# Patient Record
Sex: Female | Born: 1965 | Race: White | Hispanic: No | Marital: Married | Smoking: Former smoker
Health system: Southern US, Community
[De-identification: ages and names within clinical notes are randomized; demographics above are authoritative.]

---

## 2008-02-10 ENCOUNTER — Encounter: Admission: RE | Admit: 2008-02-10 | Discharge: 2008-02-10 | Payer: Self-pay | Admitting: Unknown Physician Specialty

## 2009-02-08 ENCOUNTER — Encounter: Admission: RE | Admit: 2009-02-08 | Discharge: 2009-02-08 | Payer: Self-pay | Admitting: Unknown Physician Specialty

## 2011-09-07 ENCOUNTER — Other Ambulatory Visit: Payer: Self-pay | Admitting: Unknown Physician Specialty

## 2011-09-07 ENCOUNTER — Ambulatory Visit
Admission: RE | Admit: 2011-09-07 | Discharge: 2011-09-07 | Disposition: A | Discharge status: Home / Self Care | Payer: Self-pay | Source: Ambulatory Visit | Attending: Unknown Physician Specialty | Admitting: Unknown Physician Specialty

## 2011-09-07 DIAGNOSIS — M542 Cervicalgia: Secondary | ICD-10-CM

## 2011-09-07 DIAGNOSIS — M546 Pain in thoracic spine: Secondary | ICD-10-CM

## 2015-12-23 ENCOUNTER — Encounter: Payer: Self-pay | Admitting: Sports Medicine

## 2015-12-23 ENCOUNTER — Ambulatory Visit (INDEPENDENT_AMBULATORY_CARE_PROVIDER_SITE_OTHER): Payer: BLUE CROSS/BLUE SHIELD | Admitting: Sports Medicine

## 2015-12-23 VITALS — BP 110/63 | HR 64 | Wt 130.0 lb

## 2015-12-23 DIAGNOSIS — M5136 Other intervertebral disc degeneration, lumbar region: Secondary | ICD-10-CM

## 2015-12-23 DIAGNOSIS — M51369 Other intervertebral disc degeneration, lumbar region without mention of lumbar back pain or lower extremity pain: Secondary | ICD-10-CM | POA: Insufficient documentation

## 2015-12-23 NOTE — Assessment & Plan Note (Signed)
Multilevel lumbar degenerative disc disease with occasional right-sided radiculopathy. Pain is predominantly axial and discogenic. Continue meloxicam which works wonders, formal physical therapy, return to see me in one month, MRI for interventional planning if no better. Also advised traction with an inversion table, her sister has one.

## 2015-12-23 NOTE — Progress Notes (Signed)
   Subjective:    I'm seeing this patient as a consultation for:  Dr. Harl Bowie  CC: Low back pain  HPI: For 7 years this pleasant 50 year old female has had predominantly axial low back pain with occasional radiation down the right leg, worse with sitting, flexion, Valsalva. She was seen by an orthopedist and prescribed meloxicam and physical therapy which was tremendously effective sometime ago, more recently she had a recurrence of pain in her back, and was appropriately prescribed steroids and meloxicam, and is feeling significantly better. No bowel or bladder dysfunction, saddle numbness, no constitutional symptoms.  Past medical history, Surgical history, Family history not pertinant except as noted below, Social history, Allergies, and medications have been entered into the medical record, reviewed, and no changes needed.   Review of Systems: No headache, visual changes, nausea, vomiting, diarrhea, constipation, dizziness, abdominal pain, skin rash, fevers, chills, night sweats, weight loss, swollen lymph nodes, body aches, joint swelling, muscle aches, chest pain, shortness of breath, mood changes, visual or auditory hallucinations.   Objective:   General: Well Developed, well nourished, and in no acute distress.  Neuro/Psych: Alert and oriented x3, extra-ocular muscles intact, able to move all 4 extremities, sensation grossly intact. Skin: Warm and dry, no rashes noted.  Respiratory: Not using accessory muscles, speaking in full sentences, trachea midline.  Cardiovascular: Pulses palpable, no extremity edema. Abdomen: Does not appear distended. Back Exam:  Inspection: Unremarkable  Motion: Flexion 45 deg, Extension 45 deg, Side Bending to 45 deg bilaterally,  Rotation to 45 deg bilaterally  SLR laying: Negative  XSLR laying: Negative  Palpable tenderness: None. FABER: negative. Sensory change: Gross sensation intact to all lumbar and sacral dermatomes.  Reflexes: 2+ at both  patellar tendons, 2+ at achilles tendons, Babinski's downgoing.  Strength at foot  Plantar-flexion: 5/5 Dorsi-flexion: 5/5 Eversion: 5/5 Inversion: 5/5  Leg strength  Quad: 5/5 Hamstring: 5/5 Hip flexor: 5/5 Hip abductors: 5/5  Gait unremarkable.  X-rays reviewed and show multilevel diffuse degenerative disc disease  Impression and Recommendations:   This case required medical decision making of moderate complexity.

## 2016-01-03 ENCOUNTER — Encounter: Payer: Self-pay | Admitting: Rehabilitative and Restorative Service Providers"

## 2016-01-03 ENCOUNTER — Ambulatory Visit (INDEPENDENT_AMBULATORY_CARE_PROVIDER_SITE_OTHER): Payer: BLUE CROSS/BLUE SHIELD | Admitting: Rehabilitative and Restorative Service Providers"

## 2016-01-03 DIAGNOSIS — M623 Immobility syndrome (paraplegic): Secondary | ICD-10-CM | POA: Diagnosis not present

## 2016-01-03 DIAGNOSIS — M5136 Other intervertebral disc degeneration, lumbar region: Secondary | ICD-10-CM

## 2016-01-03 DIAGNOSIS — R531 Weakness: Secondary | ICD-10-CM

## 2016-01-03 DIAGNOSIS — R6889 Other general symptoms and signs: Secondary | ICD-10-CM

## 2016-01-03 DIAGNOSIS — M5441 Lumbago with sciatica, right side: Secondary | ICD-10-CM | POA: Diagnosis not present

## 2016-01-03 DIAGNOSIS — Z7409 Other reduced mobility: Secondary | ICD-10-CM

## 2016-01-03 DIAGNOSIS — M2560 Stiffness of unspecified joint, not elsewhere classified: Secondary | ICD-10-CM

## 2016-01-03 NOTE — Patient Instructions (Signed)
Abdominal Bracing With Pelvic Floor (Hook-Lying)    With neutral spine, tighten pelvic floor and abdominals sucking belly button to back bone; tighten muscles in back at waist  Hold 10 sec Repeat _10_ times. Do __several _ times a day. Progress to do this in sitting; standing; walking and with functional activities.  Trunk: Prone Extension (Press-Ups)    Lie on stomach on firm, flat surface. Relax bottom and legs. Raise chest in air with elbows straight. Keep hips flat on surface, sag stomach. Hold _2-3___ seconds. Repeat _10___ times. Do _2-3___ sessions per day. CAUTION: Movement should be gentle and slow.   Trunk Extension    Standing, place back of open hands on low back. Straighten spine then arch the back and move shoulders back. Repeat __1-2_ times per session. Do _several _ sessions per day.  HIP: Hamstrings - Supine    Place strap around foot. Raise leg up, keep knee straight. Hold _30__ seconds. _3_ reps per set, _2-3__ per day   Outer Hip Stretch: Reclined IT Band Stretch (Strap)    Strap around opposite foot, pull across only as far as possible with shoulders on mat. Hold for __30 sec. Repeat __3__ times each leg.   Sleeping on Back  Place pillow under knees. A pillow with cervical support and a roll around waist are also helpful. Copyright  VHI. All rights reserved.  Sleeping on Side Place pillow between knees. Use cervical support under neck and a roll around waist as needed. Copyright  VHI. All rights reserved.   Sleeping on Stomach   If this is the only desirable sleeping position, place pillow under lower legs, and under stomach or chest as needed.  Posture - Sitting   Sit upright, head facing forward. Try using a roll to support lower back. Keep shoulders relaxed, and avoid rounded back. Keep hips level with knees. Avoid crossing legs for long periods. Stand to Sit / Sit to Stand   To sit: Bend knees to lower self onto front edge of chair,  then scoot back on seat. To stand: Reverse sequence by placing one foot forward, and scoot to front of seat. Use rocking motion to stand up.   Work Height and Reach  Ideal work height is no more than 2 to 4 inches below elbow level when standing, and at elbow level when sitting. Reaching should be limited to arm's length, with elbows slightly bent.  Bending  Bend at hips and knees, not back. Keep feet shoulder-width apart.    Posture - Standing   Good posture is important. Avoid slouching and forward head thrust. Maintain curve in low back and align ears over shoul- ders, hips over ankles.  Alternating Positions   Alternate tasks and change positions frequently to reduce fatigue and muscle tension. Take rest breaks. Computer Work   Position work to Art gallery managerface forward. Use proper work and seat height. Keep shoulders back and down, wrists straight, and elbows at right angles. Use chair that provides full back support. Add footrest and lumbar roll as needed.  Getting Into / Out of Car  Lower self onto seat, scoot back, then bring in one leg at a time. Reverse sequence to get out.  Dressing  Lie on back to pull socks or slacks over feet, or sit and bend leg while keeping back straight.    Housework - Sink  Place one foot on ledge of cabinet under sink when standing at sink for prolonged periods.   Pushing / Pulling  Pushing  is preferable to pulling. Keep back in proper alignment, and use leg muscles to do the work.  Deep Squat   Squat and lift with both arms held against upper trunk. Tighten stomach muscles without holding breath. Use smooth movements to avoid jerking.  Avoid Twisting   Avoid twisting or bending back. Pivot around using foot movements, and bend at knees if needed when reaching for articles.  Carrying Luggage   Distribute weight evenly on both sides. Use a cart whenever possible. Do not twist trunk. Move body as a unit.   Lifting Principles .Maintain  proper posture and head alignment. .Slide object as close as possible before lifting. .Move obstacles out of the way. .Test before lifting; ask for help if too heavy. .Tighten stomach muscles without holding breath. .Use smooth movements; do not jerk. .Use legs to do the work, and pivot with feet. .Distribute the work load symmetrically and close to the center of trunk. .Push instead of pull whenever possible.   Ask For Help   Ask for help and delegate to others when possible. Coordinate your movements when lifting together, and maintain the low back curve.  Log Roll   Lying on back, bend left knee and place left arm across chest. Roll all in one movement to the right. Reverse to roll to the left. Always move as one unit. Housework - Sweeping  Use long-handled equipment to avoid stooping.   Housework - Wiping  Position yourself as close as possible to reach work surface. Avoid straining your back.  Laundry - Unloading Wash   To unload small items at bottom of washer, lift leg opposite to arm being used to reach.  Gardening - Raking  Move close to area to be raked. Use arm movements to do the work. Keep back straight and avoid twisting.     Cart  When reaching into cart with one arm, lift opposite leg to keep back straight.   Getting Into / Out of Bed  Lower self to lie down on one side by raising legs and lowering head at the same time. Use arms to assist moving without twisting. Bend both knees to roll onto back if desired. To sit up, start from lying on side, and use same move-ments in reverse. Housework - Vacuuming  Hold the vacuum with arm held at side. Step back and forth to move it, keeping head up. Avoid twisting.   Laundry - Armed forces training and education officer so that bending and twisting can be avoided.   Laundry - Unloading Dryer  Squat down to reach into clothes dryer or use a reacher.  Gardening - Weeding / Psychiatric nurse or Kneel. Knee pads  may be helpful.

## 2016-01-03 NOTE — Therapy (Signed)
[facility] [place] [address] [place] [place] [address] [place], [place], [address] Phone: [phone]   Fax:  [phone]  Physical Therapy Evaluation  Patient Details  Name: Debra Clayton MRN: [id number] Date of Birth: [birth year] Referring Provider: Dr. Benjamin Stain   Encounter Date: 01/03/2016      PT End of Session - 01/03/16 0739    Visit Number 1   Number of Visits 12   Date for PT Re-Evaluation 02/14/16   PT Start Time 0740   PT Stop Time 0838   PT Time Calculation (min) 58 min   Activity Tolerance Patient tolerated treatment well      History reviewed. No pertinent past medical history.  History reviewed. No pertinent past surgical history.  There were no vitals filed for this visit.  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 She has had a flare up of LBP over the last 1-2 months with sympotms progressively worsened over the past  few weeks. She was treated with muscle relaxants and steroid dose pac. She has had a great response to meds and is feeling much better.    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 5-10 min    How long can you stand comfortably? 5-10 min    How long can you walk comfortably? no limit   Diagnostic tests xrays    Patient Stated Goals prevent recurrent LBP    Currently in Pain? No/denies   Pain Score 0-No pain   Pain Location Back   Pain Orientation Lower;Mid   Pain Radiating Towards down  posterior thigh to knee intermittly - none in the past week    Pain Onset More than a month ago   Pain Frequency Intermittent   Aggravating Factors  prolonged sitting or standing; household chores   Pain Relieving Factors meds; TENS unit; heat             [facility] PT Assessment - 01/03/16 0001    Assessment   Medical Diagnosis Lumbar DDD   Referring Provider Dr. Benjamin Stain    Onset Date/Surgical Date 11/24/15   Hand Dominance Right   Next MD Visit no return    Prior Therapy for LBP ~7 yrs ago    Precautions   Precautions None   Balance Screen   Has the patient fallen in the past 6 months No   Has the patient had a decrease in activity level because of a fear of falling?  No   Is the patient reluctant to leave their home because of a fear of falling?  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    Observation/Other Assessments   Focus on Therapeutic Outcomes (FOTO)  37% ;limitations    Sensation   Additional Comments intermittent numbness in posterior Rt thigh which she notices randomly    Posture/Postural Control   Posture Comments slight head forward posture - good alignment   AROM   Right/Left Hip --  WFL's bilat    Lumbar Flexion 85%   Lumbar Extension 30%   Lumbar - Right Side Bend 75%   Lumbar - Left Side Bend 75%   Lumbar - Right Rotation 75%   Lumbar - Left Rotation 75%   Strength   Overall Strength Comments 5/5 Lt LE/Rt LE except hip ext, abd 5-/5    Flexibility   Hamstrings Rt 70 deg; lt 75 deg    Quadriceps heel to buttock Rt 5 in; Lt 4 in   ITB tight Rt   Piriformis  tight Rt   Palpation   Spinal mobility tight PA mobs lower lumbar no pain    Palpation comment tight Rt lumbar paraspinals;  glut med; piriformis    Special Tests    Special Tests --  tight figure 4 Rt no radicular pain                    [facility] Adult PT Treatment/Exercise - 01/03/16 0001    Self-Care   Self-Care --  initiated spine care   Lumbar Exercises: Stretches   Passive Hamstring Stretch 3 reps;30 seconds   Press Ups --  2-3 sec hold 10 reps    ITB Stretch 3 reps;30 seconds   Lumbar Exercises: Supine   Ab Set --  3 part core 10 sec x 10    Moist Heat Therapy   Number Minutes Moist Heat 15 Minutes   Moist Heat Location Lumbar Spine   Electrical Stimulation   Electrical Stimulation Location bilat L4/5; Rt hip    Electrical Stimulation Action IFC   Electrical Stimulation Parameters to tolerance   Electrical Stimulation Goals Pain;Tone                PT Education - 01/03/16 0818    Education provided Yes   Education Details HEP; spine care    Person(s) Educated Patient   Methods Explanation;Demonstration;Tactile cues;Verbal cues;Handout   Comprehension Verbalized understanding;Returned demonstration;Verbal cues required;Tactile cues required             PT Long Term Goals - 01/03/16 0839    PT LONG TERM GOAL #1   Title Improve core stability allowing pt to perform normal functional activities without pain 02/14/16   Time 6   Period Weeks   Status New   PT LONG TERM GOAL #2   Title Improve tissue extensibility through Rt hip musculature including hamstrings; quads; IT band; piriformis - Rt =/> Lt 02/14/16   Time 6   Period Weeks   Status New   PT LONG TERM GOAL #3   Title Improve sitting and standing tolerance to 30-45 min without symptoms 02/14/16   Time 6   Period Weeks   Status New   PT LONG TERM GOAL #4   Title I in HEP 02/14/16    Time 6   Period Weeks   Status New   PT LONG TERM GOAL #5   Title Improve FOTO to </= 25%  limitation 02/14/16   Time 6   Period Weeks   Status New  Plan - 01/03/16 0847    Clinical Impression Statement Patient presents with recurrent LBP and Rt LE radicular symptoms consistent with lumbar DDD. She has limited trunk extension; tightness through Rt hip musculature; myofacial tightness to palpation in the lumbar and Rt hip musculature; and limited functional activity due to pain. She will benefit from  PT to address deficits identified.    Pt will benefit from skilled therapeutic intervention in order to improve on the following deficits Postural dysfunction;Improper body mechanics;Increased fascial restricitons;Decreased range of motion;Decreased mobility;Decreased strength;Pain;Decreased endurance;Decreased activity tolerance   Rehab Potential Good   PT Frequency 2x / week   PT Duration 6 weeks   PT Treatment/Interventions Patient/family education;ADLs/Self Care Home Management;Therapeutic exercise;Therapeutic activities;Manual techniques;Dry needling;Neuromuscular re-education;Cryotherapy;Electrical Stimulation;Iontophoresis 4mg /ml Dexamethasone;Moist Heat;Ultrasound   PT Next Visit Plan stretch piriformis/quads; progress with core stabilization; manual work to LB and Rt hip as needed; modalities as indicated; cont spinal education    PT Home Exercise Plan spine care; ergonomics; HEP    Consulted and Agree with Plan of Care Patient         Problem List Patient Active Problem List   Diagnosis Date Noted  . Lumbar degenerative disc disease 12/23/2015    Yaneliz Radebaugh Rober MinionP Jordayn Mink PT, MPH  01/03/2016, 8:54 AM  [facility] [address] [place], [place], [address] Phone: [phone]   Fax:  [phone]  Name: Debra Clayton MRN: [id number] Date of Birth: 02/05/1966

## 2016-01-05 ENCOUNTER — Ambulatory Visit (INDEPENDENT_AMBULATORY_CARE_PROVIDER_SITE_OTHER): Payer: BLUE CROSS/BLUE SHIELD | Admitting: Physical Therapy

## 2016-01-05 DIAGNOSIS — M2560 Stiffness of unspecified joint, not elsewhere classified: Secondary | ICD-10-CM

## 2016-01-05 DIAGNOSIS — Z7409 Other reduced mobility: Secondary | ICD-10-CM | POA: Diagnosis not present

## 2016-01-05 DIAGNOSIS — M5136 Other intervertebral disc degeneration, lumbar region: Secondary | ICD-10-CM

## 2016-01-05 DIAGNOSIS — M623 Immobility syndrome (paraplegic): Secondary | ICD-10-CM

## 2016-01-05 DIAGNOSIS — M5441 Lumbago with sciatica, right side: Secondary | ICD-10-CM

## 2016-01-05 DIAGNOSIS — R531 Weakness: Secondary | ICD-10-CM

## 2016-01-05 NOTE — Therapy (Signed)
[facility] [address] [place] [address] [place], [place], [address] Phone: [phone]   Fax:  [phone]  Physical Therapy Treatment  Patient Details  Name: Debra Clayton MRN: [id number] Date of Birth: 07/28/1966 Referring Provider: Dr. Briant Siteshekkekandem   Encounter Date: 01/05/2016      PT End of Session - 01/05/16 0805    Visit Number 2   Number of Visits 12   Date for PT Re-Evaluation 02/14/16   PT Start Time 0733   PT Stop Time 0820   PT Time Calculation (min) 47 min   Activity Tolerance Patient tolerated treatment well;No increased pain      No past medical history on file.  No past surgical history on file.  There were no vitals filed for this visit.  Visit Diagnosis:  DDD (degenerative disc disease), lumbar  Bilateral low back pain with right-sided sciatica  Stiffness due to immobility  Decreased strength, endurance, and mobility      Subjective Assessment - 01/05/16 0736    Subjective Pt reports she has been tender and sore since last visit.  Took Mobic last night due to pain (3-4/10).  Used TENS last night for pain relief.    Currently in Pain? No/denies            [facility] PT Assessment - 01/05/16 0001    Assessment   Medical Diagnosis Lumbar DDD   Referring Provider Dr. Briant Siteshekkekandem    Onset Date/Surgical Date 11/24/15   Hand Dominance Right   Next MD Visit PRN          [facility] - 01/05/16 0001    Lumbar Exercises: Stretches   Passive Hamstring Stretch 30 seconds;3 reps  sitting up    Piriformis Stretch 2 reps;30 seconds  sitting up    Piriformis Stretch Limitations repeated stretch in supine    Lumbar Exercises: Aerobic   Stationary Bike NuStep L5: 5 min    Lumbar Exercises: Supine   Ab Set 5 reps;5 seconds   Lumbar Exercises: Sidelying   Clam 15 reps  each side   Moist Heat Therapy   Number Minutes Moist Heat 15 Minutes   Moist Heat Location Hip;Lumbar Spine   Electrical Stimulation   Electrical Stimulation Location Rt glute/ deep rotators   Electrical Stimulation Action IFC   Electrical Stimulation Parameters to tolerance    Electrical Stimulation Goals Tone;Pain   Manual Therapy   Manual Therapy Soft tissue mobilization   Manual therapy comments pt in prone and sidelying    Soft tissue mobilization to Rt/Lt hip abd/deep rotators; TPR to Rt glute med/piriformis           PT Long Term Goals - 01/03/16 0839    PT LONG TERM GOAL #1   Title Improve core stability allowing pt to perform normal functional activities without pain 02/14/16   Time 6   Period Weeks   Status New   PT LONG TERM GOAL #2   Title Improve tissue extensibility through Rt hip musculature including hamstrings; quads; IT band; piriformis - Rt =/> Lt 02/14/16   Time 6   Period Weeks   Status New   PT LONG TERM GOAL #3   Title Improve sitting and standing tolerance to 30-45 min without symptoms 02/14/16   Time 6   Period Weeks   Status New   PT LONG TERM GOAL #4   Title I in HEP 02/14/16    Time 6   Period Weeks   Status New  PT LONG TERM GOAL #5   Title Improve FOTO to </= 25% limitation 02/14/16   Time 6   Period Weeks   Status New               Plan - 01/05/16 0806    Clinical Impression Statement Pt point tender in Rt glute with manual therapy; reduced pain and tightness with use of MHP and estim at end of session.  Pt tolerated new exercises without increase in symptoms or pain.    Pt will benefit from skilled therapeutic intervention in order to improve on the following deficits Postural dysfunction;Improper body mechanics;Increased fascial restricitons;Decreased range of motion;Decreased mobility;Decreased strength;Pain;Decreased endurance;Decreased activity tolerance   Rehab Potential Good   PT Frequency 2x / week   PT Duration 6 weeks   PT Treatment/Interventions Patient/family education;ADLs/Self Care Home Management;Therapeutic  exercise;Therapeutic activities;Manual techniques;Dry needling;Neuromuscular re-education;Cryotherapy;Electrical Stimulation;Iontophoresis /ml Dexamethasone;Moist Heat;Ultrasound   PT Next Visit Plan Add quad stretch to HEP.  Progress core stabilization exercises and add to HEP if tolerated   Consulted and Agree with Plan of Care Patient        Problem List Patient Active Problem List   Diagnosis Date Noted  . Lumbar degenerative disc disease 12/23/2015   Mayer Camel, PTA 01/05/2016 12:49 PM  [facility] [place] [address] [place], [place], [address] Phone: [phone]   Fax:  [phone]  Name: Debra Clayton MRN: [id number] Date of Birth: August 19, 1966

## 2016-01-10 ENCOUNTER — Ambulatory Visit (INDEPENDENT_AMBULATORY_CARE_PROVIDER_SITE_OTHER): Payer: BLUE CROSS/BLUE SHIELD | Admitting: Physical Therapy

## 2016-01-10 DIAGNOSIS — M623 Immobility syndrome (paraplegic): Secondary | ICD-10-CM

## 2016-01-10 DIAGNOSIS — M5136 Other intervertebral disc degeneration, lumbar region: Secondary | ICD-10-CM

## 2016-01-10 DIAGNOSIS — M5441 Lumbago with sciatica, right side: Secondary | ICD-10-CM | POA: Diagnosis not present

## 2016-01-10 DIAGNOSIS — Z7409 Other reduced mobility: Secondary | ICD-10-CM | POA: Diagnosis not present

## 2016-01-10 DIAGNOSIS — M2560 Stiffness of unspecified joint, not elsewhere classified: Secondary | ICD-10-CM

## 2016-01-10 DIAGNOSIS — R6889 Other general symptoms and signs: Secondary | ICD-10-CM

## 2016-01-10 DIAGNOSIS — R531 Weakness: Secondary | ICD-10-CM

## 2016-01-10 NOTE — Therapy (Addendum)
[facility] [place], [place], [address] Phone: [phone]   Fax:  [phone]  Physical Therapy Treatment  Patient Details  Name: Debra Clayton MRN: [id number] Date of Birth: June 21, 1966 Referring Provider: Dr. Helane Rima  Encounter Date: 01/10/2016      PT End of Session - 01/10/16 0736    Visit Number 3   Number of Visits 12   Date for PT Re-Evaluation 02/14/16   PT Start Time 0733   PT Stop Time 0819   PT Time Calculation (min) 46 min      No past medical history on file.  No past surgical history on file.  There were no vitals filed for this visit.  Visit Diagnosis:  DDD (degenerative disc disease), lumbar  Bilateral low back pain with right-sided sciatica  Stiffness due to immobility  Decreased strength, endurance, and mobility      Subjective Assessment - 01/10/16 0736    Subjective Pt reports she felt good after last visit.  Is using TENS at night when she has discomfort.  Has been conscious of how she is performing HEP to change her old habits.    Currently in Pain? No/denies            [facility] PT Assessment - 01/10/16 0001    Assessment   Medical Diagnosis Lumbar DDD   Referring Provider Dr. Helane Rima   Onset Date/Surgical Date 11/24/15   Hand Dominance Right   Next MD Visit PRN   Observation/Other Assessments   Focus on Therapeutic Outcomes (FOTO)  12% limited    AROM   Lumbar Flexion WNL   Lumbar Extension 80%   Lumbar - Right Side Bend WNL   Lumbar - Left Side Bend WNL   Lumbar - Right Rotation WNL   Lumbar - Left Rotation WNL           [facility] Adult PT Treatment/Exercise - 01/10/16 0001    Self-Care   Self-Care Other Self-Care Comments   Other Self-Care Comments  Pt educated on use of small ball for self massage to low back and hips. Pt returned demo.    Lumbar Exercises: Stretches   Passive Hamstring Stretch 30 seconds;3 reps  sitting up    Press Ups --   2-3 sec hold 10 reps    Piriformis Stretch 2 reps;30 seconds  sitting up    Lumbar Exercises: Supine   Ab Set 5 reps;5 seconds   Clam 10 reps  each leg with ab set   Bent Knee Raise 10 reps  each leg with ab set   Lumbar Exercises: Prone   Other Prone Lumbar Exercises pelvic presses x 5 sec x 10 reps; repeated with unilateral knee flex/ext x 10; with hip ext knee straight x 10 each leg    Moist Heat Therapy   Number Minutes Moist Heat 15 Minutes   Moist Heat Location Hip;Lumbar Spine   Electrical Stimulation   Electrical Stimulation Location Rt glute/ deep rotators   Electrical Stimulation Action IFC   Electrical Stimulation Parameters to tolerance    Electrical Stimulation Goals Tone;Pain                PT Education - 01/10/16 1705    Education provided Yes   Education Details HEP    Person(s) Educated Patient   Methods Explanation;Handout   Comprehension Verbalized understanding;Returned demonstration             PT Long Term Goals - 01/10/16 [phone]  PT LONG TERM GOAL #1   Title Improve core stability allowing pt to perform normal functional activities without pain 02/14/16   Time 6   Period Weeks   Status Achieved   PT LONG TERM GOAL #2   Title Improve tissue extensibility through Rt hip musculature including hamstrings; quads; IT band; piriformis - Rt =/> Lt 02/14/16   Time 6   Period Weeks   Status On-going   PT LONG TERM GOAL #3   Title Improve sitting and standing tolerance to 30-45 min without symptoms 02/14/16   Time 6   Period Weeks   Status Not Met  still has symptoms if sititng longer than 15 min    PT LONG TERM GOAL #4   Title I in HEP 02/14/16    Time 6   Period Weeks   Status On-going   PT LONG TERM GOAL #5   Title Improve FOTO to </= 25% limitation 02/14/16   Time 6   Period Weeks   Status Achieved               Plan - 01/10/16 1708    Clinical Impression Statement Pt has partially met her goals.  Pt tolerated all exercise  with minimal increase in pain. Pt has requested to hold therapy for 2 wks; if no problem she would like to d/c to HEP at that time.    Pt will benefit from skilled therapeutic intervention in order to improve on the following deficits Postural dysfunction;Improper body mechanics;Increased fascial restricitons;Decreased range of motion;Decreased mobility;Decreased strength;Pain;Decreased endurance;Decreased activity tolerance   Rehab Potential Good   PT Frequency 2x / week   PT Duration 6 weeks   PT Treatment/Interventions Patient/family education;ADLs/Self Care Home Management;Therapeutic exercise;Therapeutic activities;Manual techniques;Dry needling;Neuromuscular re-education;Cryotherapy;Electrical Stimulation;Iontophoresis 4mg/ml Dexamethasone;Moist Heat;Ultrasound   PT Next Visit Plan Spoke to supervising PT regarding pt's progress and her desire to hold therapy.  Will hold for 2 wks, then d/c to HEP if no further appts.     Consulted and Agree with Plan of Care Patient        Problem List Patient Active Problem List   Diagnosis Date Noted  . Lumbar degenerative disc disease 12/23/2015   Jennifer Carlson-Long, PTA 01/10/2016 5:10 PM  [facility] [address] [place], [place], [address] Phone: [phone]   Fax:  [phone]  Name: Debra Clayton MRN: [id number] Date of Birth: 02/27/1966    PHYSICAL THERAPY DISCHARGE SUMMARY  Visits from Start of Care: 3  Current functional level related to goals / functional outcomes: Progressing weill with rehab and use of TENS unit at home   Remaining deficits: Needs to continue with HEP    Education / Equipment: HEP  Plan: Patient agrees to discharge.  Patient goals were partially met. Patient is being discharged due to not returning since the last visit.  ?????    Celyn P. Holt PT, MPH 02/24/2016 8:31 AM   

## 2016-01-10 NOTE — Patient Instructions (Signed)
Pelvic Press     Place hands under belly between navel and pubic bone, palms up. Feel pressure on hands. Increase pressure on hands by pressing pelvis down. This is NOT a pelvic tilt. Hold __5_ seconds. Relax. Repeat _10__ times. Once a day.  KNEE: Flexion - Prone   Hold pelvic press. Bend knee, then return the foot down. Repeat on opposite leg. Do not raise hips. _10__ reps per set. When this is mastered, pull both heels up at same time, x 10 reps.  Once a day   Leg Lift: One-Leg   Press pelvis down. Keep knee straight; lengthen and lift one leg (from waist). Do not twist body. Keep other leg down. Hold _1__ seconds. Relax. Repeat 10 time. Repeat with other leg.    Abdominal Bracing With Pelvic Floor (Hook-Lying)   With neutral spine, tighten pelvic floor and abdominals. Hold 10 seconds. Repeat __10_ times. Do _1__ times a day.   Knee to Chest: Transverse Plane Stability   Bring one knee up, then return. Be sure pelvis does not roll side to side. Keep pelvis still. Lift knee __10_ times each leg. Restabilize pelvis. Repeat with other leg. Do _1-2__ sets, _1__ times per day.  Hip External Rotation With Pillow: Transverse Plane Stability   One knee bent, one leg straight, on pillow. Slowly roll bent knee out. Be sure pelvis does not rotate. Do _10__ times. Restabilize pelvis. Repeat with other leg. Do _1-2__ sets, _1__ times per day.  [facility] [address] [place], [place] [address]  [phone] (office) [phone] (fax)

## 2016-01-12 ENCOUNTER — Encounter: Payer: BLUE CROSS/BLUE SHIELD | Admitting: Physical Therapy

## 2016-02-14 ENCOUNTER — Ambulatory Visit (INDEPENDENT_AMBULATORY_CARE_PROVIDER_SITE_OTHER): Payer: BLUE CROSS/BLUE SHIELD

## 2016-02-14 ENCOUNTER — Other Ambulatory Visit: Payer: Self-pay | Admitting: Family Medicine

## 2016-02-14 DIAGNOSIS — R52 Pain, unspecified: Secondary | ICD-10-CM

## 2016-02-14 DIAGNOSIS — R059 Cough, unspecified: Secondary | ICD-10-CM

## 2016-02-14 DIAGNOSIS — R05 Cough: Secondary | ICD-10-CM

## 2016-02-14 DIAGNOSIS — R079 Chest pain, unspecified: Secondary | ICD-10-CM

## 2016-02-15 ENCOUNTER — Ambulatory Visit (INDEPENDENT_AMBULATORY_CARE_PROVIDER_SITE_OTHER): Payer: BLUE CROSS/BLUE SHIELD

## 2016-02-15 ENCOUNTER — Encounter: Payer: Self-pay | Admitting: Podiatry

## 2016-02-15 ENCOUNTER — Ambulatory Visit (INDEPENDENT_AMBULATORY_CARE_PROVIDER_SITE_OTHER): Payer: BLUE CROSS/BLUE SHIELD | Admitting: Podiatry

## 2016-02-15 VITALS — BP 104/54 | HR 57 | Resp 16

## 2016-02-15 DIAGNOSIS — M2040 Other hammer toe(s) (acquired), unspecified foot: Secondary | ICD-10-CM

## 2016-02-15 DIAGNOSIS — Q828 Other specified congenital malformations of skin: Secondary | ICD-10-CM | POA: Diagnosis not present

## 2016-02-15 NOTE — Progress Notes (Signed)
   Subjective:    Patient ID: Debra RidingJanie Clay, female    DOB: 09/07/1966, 50 y.o.   MRN: [id number]  HPI: She presents today with several month duration of small painful lesions to the lateral aspect of the fourth toes bilaterally. She states that it feels like small pebbles between my toes. Exquisitely painful she has utilized chemically treating pads to help alleviate her symptoms. She states that the right one has resolved pretty nicely however the left one is squeezing very painful. She relates that she exercises and runs a lot and wears tennis shoe (New Balance).    Review of Systems  Musculoskeletal: Positive for back pain and arthralgias.  All other systems reviewed and are negative.      Objective:   Physical Exam: I have reviewed her past medical history medications allergies surgery social history and review of symptoms. Pulses are strongly palpable. Neurologic sensorium is intact. Deep tendon reflexes are intact bilateral muscle strength is normal bilateral. Deep tendon reflexes are intact bilaterally muscle strength +5 over 5 dorsiflexion plantar flexors and inverters everters all intrinsic musculature is intact. She has mild adductor varus rotated hammertoe deformities fifth bilateral injected position to the lateral aspect of a hypertrophic fourth PIPJ. This is resulting in a reactive hyperkeratosis and porokeratotic lesion lateral aspect of the fourth toe fifth toe appears to be clear of any type of abnormality other than the aforementioned hammertoe deformity. Cutaneous evaluation does demonstrate a solitary porokeratotic lesion to the lateral aspect of the PIPJ fourth digit left foot one that has resolved on the right foot as well. No open lesions or wounds no bleeding is noted. No cellulitis.        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  Lamb: Discussed etiology pathology conservative versus  surgical therapies. We discussed wider shoe gear and appropriate exercise equipment. We also debrided the reactive hyperkeratosis leading her brace and to manage. There were no iatrogenic leading lesions and no bleeding. Follow up with her as needed.

## 2017-02-22 ENCOUNTER — Ambulatory Visit (INDEPENDENT_AMBULATORY_CARE_PROVIDER_SITE_OTHER): Payer: BLUE CROSS/BLUE SHIELD | Admitting: Podiatry

## 2017-02-22 ENCOUNTER — Encounter: Payer: Self-pay | Admitting: Podiatry

## 2017-02-22 ENCOUNTER — Ambulatory Visit (INDEPENDENT_AMBULATORY_CARE_PROVIDER_SITE_OTHER): Payer: BLUE CROSS/BLUE SHIELD

## 2017-02-22 DIAGNOSIS — M7752 Other enthesopathy of left foot: Secondary | ICD-10-CM | POA: Diagnosis not present

## 2017-02-22 DIAGNOSIS — M778 Other enthesopathies, not elsewhere classified: Secondary | ICD-10-CM

## 2017-02-22 DIAGNOSIS — M2040 Other hammer toe(s) (acquired), unspecified foot: Secondary | ICD-10-CM

## 2017-02-22 DIAGNOSIS — M779 Enthesopathy, unspecified: Secondary | ICD-10-CM

## 2017-02-22 NOTE — Progress Notes (Signed)
She presents today with chronic pain to the fifth digit of the left foot. She states that seems to be getting smaller sore as time goes on. She denies any trauma.  Objective: Vital signs are stable she is alert and oriented 3. She has some range of motion at the fifth PIPJ and DIPJ but very little. There is mild tenderness on palpation of these areas there is a reactive hyperkeratotic lesion overlying the dorsolateral PIPJ. 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  Assessment: Capsulitis digital deformity with exostosis.  Plan: Injected the toe around the PIPJ today with dexamethasone and local anesthetic discussed the possible need for hammertoe repair she understands this is amenable to a will follow up with us in the fall for such procedure.

## 2017-12-05 ENCOUNTER — Telehealth: Payer: Self-pay | Admitting: *Deleted

## 2017-12-05 NOTE — Telephone Encounter (Signed)
Pt states she is scheduled for 12/13/2017 with Dr. Al CorpusHyatt in hopes of getting a cortisone injection in her left 5th toe, last injected 02/22/2017. Pt states she would like to know if she will be able to get another injection at that time.

## 2017-12-05 NOTE — Telephone Encounter (Signed)
I told pt that in most cases she would be able to get the injection unless something else is going on, or she and Dr. Al CorpusHyatt choose a different treatment plan.

## 2017-12-13 ENCOUNTER — Ambulatory Visit: Payer: BLUE CROSS/BLUE SHIELD | Admitting: Podiatry

## 2017-12-13 ENCOUNTER — Encounter: Payer: Self-pay | Admitting: Podiatry

## 2017-12-13 DIAGNOSIS — M2042 Other hammer toe(s) (acquired), left foot: Secondary | ICD-10-CM

## 2017-12-13 DIAGNOSIS — M7752 Other enthesopathy of left foot: Secondary | ICD-10-CM

## 2017-12-13 DIAGNOSIS — M779 Enthesopathy, unspecified: Secondary | ICD-10-CM

## 2017-12-13 DIAGNOSIS — M778 Other enthesopathies, not elsewhere classified: Secondary | ICD-10-CM

## 2017-12-16 NOTE — Progress Notes (Signed)
She presents today for follow-up of her hammertoe fifth.  She states please give me a shot this toe is giving me a fit.  Objective: Vital signs are stable alert and oriented x3.  Pulses are palpable.  No change in neurovascular status no cutaneous changes.  She has pain on palpation of the PIPJ of the fifth digit of the left foot.  This is consistent with osteoarthritic changes.  Capsulitis.  Assessment: Capsulitis osteoarthritis fifth PIPJ left.  Plan: After sterile Betadine skin prep and verbal consent I injected 2 mg of dexamethasone and local anesthetic subcutaneously at the level of the PIPJ second left.  She tolerated procedure well without complications we did discuss the need for surgical intervention in the future she understands and is amenable to a follow-up with me as needed.

## 2019-09-11 ENCOUNTER — Encounter: Payer: Self-pay | Admitting: Emergency Medicine

## 2019-09-11 ENCOUNTER — Ambulatory Visit
Admission: EM | Admit: 2019-09-11 | Discharge: 2019-09-11 | Disposition: A | Discharge status: Home / Self Care | Payer: BC Managed Care – PPO | Attending: Nurse Practitioner | Admitting: Nurse Practitioner

## 2019-09-11 ENCOUNTER — Other Ambulatory Visit: Payer: Self-pay

## 2019-09-11 DIAGNOSIS — M545 Low back pain, unspecified: Secondary | ICD-10-CM

## 2019-09-11 LAB — POCT URINALYSIS DIP (MANUAL ENTRY)
Bilirubin, UA: NEGATIVE
Blood, UA: NEGATIVE
Glucose, UA: NEGATIVE mg/dL
Ketones, POC UA: NEGATIVE mg/dL
Leukocytes, UA: NEGATIVE
Nitrite, UA: NEGATIVE
Protein Ur, POC: NEGATIVE mg/dL
Spec Grav, UA: 1.02
Urobilinogen, UA: 0.2 U/dL
pH, UA: 6.5

## 2019-09-11 MED ORDER — OXYCODONE-ACETAMINOPHEN 5-325 MG PO TABS: 1.0000 | ORAL_TABLET | ORAL | 0 refills | Status: AC | PRN

## 2019-09-11 MED ORDER — DEXAMETHASONE SODIUM PHOSPHATE 10 MG/ML IJ SOLN
10.0000 mg | Freq: Once | INTRAMUSCULAR | Status: AC
  Administered 2019-09-11: 10 mg via INTRAMUSCULAR

## 2019-09-11 MED ORDER — KETOROLAC TROMETHAMINE 60 MG/2ML IM SOLN
60.0000 mg | Freq: Once | INTRAMUSCULAR | Status: AC
  Administered 2019-09-11: 11:00:00 60 mg via INTRAMUSCULAR

## 2019-09-11 NOTE — ED Triage Notes (Addendum)
Pt presents to [facility] for assessment of left lower back pain after she was bent over last night looking in her makeup mirror, and felt "something happen" and has been having worsening of her chronic back pain, and it now radiates to her left groin.  Pt took 2 leftover Vicodin from a surgery a few years ago before arrival today

## 2019-09-11 NOTE — ED Provider Notes (Signed)
[facility]    CSN: [id number] Arrival date & time: 09/11/19  1023      History   Chief Complaint Chief Complaint  Patient presents with  . Back Pain    HPI Debra Clayton is a 53 y.o. female.   Subjective:  Debra Clayton is a 53 y.o. female who presents for evaluation of low back pain. The patient has had recurrent self limited episodes of low back pain in the past, previous osteoarthritis of lumbar spine and a previous herniated disc. Symptoms have been present for 1 week and are gradually worsening.  Onset was related to / precipitated by no known injury. The pain is located in the across the lower back and radiates to the left lower leg. The pain is described as sharp, stabbing, stiffness and throbbing and occurs intermittently. She rates her pain as a 10 on a scale of 0-10. Symptoms are exacerbated by extension and flexion. Symptoms are improved by muscle relaxants and narcotic pain medications. She has also tried meloxicam which provided minimal symptom relief. She denies any leg weakness, tingling/burning sensations in the leg, urinary incontinence, bowel incontinence, groin/perineal numbness, dysuria or frequency associated with the back pain. No fevers, malaise, nausea or vomiting. The patient has no "red flag" history indicative of complicated back pain.  The following portions of the patient's history were reviewed and updated as appropriate: allergies, current medications, past family history, past medical history, past social history, past surgical history and problem list.           History reviewed. No pertinent past medical history.  Patient Active Problem List   Diagnosis Date Noted  . Lumbar degenerative disc disease 12/23/2015    History reviewed. No pertinent surgical history.  OB History   No obstetric history on file.      Home Medications    Prior to Admission medications   Medication Sig Start Date End Date Taking? Authorizing  Provider  Ascorbic Acid (VITAMIN C PO) Take by mouth.    [provider]  BIOTIN PO Take by mouth.    [provider]  doxycycline (VIBRA-TABS) 100 MG tablet doxycycline hyclate 100 mg tablet    [provider]  meloxicam (MOBIC) 15 MG tablet Take 15 mg by mouth daily. 11/29/15   [provider]  methocarbamol (ROBAXIN) 500 MG tablet Take 500 mg by mouth 3 (three) times daily. 12/10/15   [provider]  MULTIPLE VITAMIN PO Take by mouth.    [provider]  oxyCODONE-acetaminophen (PERCOCET/ROXICET) 5-325 MG tablet Take 1 tablet by mouth every 4 (four) hours as needed for up to 5 days for severe pain. 09/11/19 09/16/19  Enrique Sack, FNP  PRO[facility]IR HFA [phone] Base) MCG/ACT inhaler USE 2 PUFFS INHALED EVERY 4-6 HOURS AS NEEDED 12/15/15   [provider]    Family History Family History  Problem Relation Age of Onset  . Diabetes Mother   . Thyroid disease Mother   . Diabetes Father   . Hypertension Father   . Skin cancer Father   . Prostate cancer Father     Social History Social History   Tobacco Use  . Smoking status: Former Research scientist (life sciences)  . Smokeless tobacco: Never Used  Substance Use Topics  . Alcohol use: Not Currently    Alcohol/week: 0.0 standard drinks  . Drug use: Never     Allergies   Penicillins   Review of Systems Review of Systems  Constitutional: Negative for fever.  Gastrointestinal: Negative for  nausea and vomiting.  Genitourinary: Negative for dysuria and frequency.  Musculoskeletal: Positive for back pain.  All other systems reviewed and are negative.    Physical Exam Triage Vital Signs ED Triage Vitals  Enc Vitals Group     BP 09/11/19 1036 94/63     Pulse Rate 09/11/19 1036 (!) 55     Resp 09/11/19 1036 16     Temp 09/11/19 1036 98.5 F (36.9 C)     Temp Source 09/11/19 1036 Oral     SpO2 09/11/19 1036 96 %     Weight --      Height --      Head Circumference --      Peak Flow --       Pain Score 09/11/19 1033 8     Pain Loc --      Pain Edu? --      Excl. in GC? --    No data found.  Updated Vital Signs BP 94/63 (BP Location: Left Arm)   Pulse (!) 55   Temp 98.5 F (36.9 C) (Oral)   Resp 16   SpO2 96%   Visual Acuity Right Eye Distance:   Left Eye Distance:   Bilateral Distance:    Right Eye Near:   Left Eye Near:    Bilateral Near:     Physical Exam Vitals signs reviewed.  Constitutional:      General: She is not in acute distress.    Appearance: Normal appearance. She is not ill-appearing or toxic-appearing.  HENT:     Head: Normocephalic.     Right Ear: Tympanic membrane normal.     Left Ear: Tympanic membrane normal.     Nose: Nose normal.     Mouth/Throat:     Mouth: Mucous membranes are moist.  Eyes:     Extraocular Movements: Extraocular movements intact.     Conjunctiva/sclera: Conjunctivae normal.     Pupils: Pupils are equal, round, and reactive to light.  Neck:     Musculoskeletal: Normal range of motion.  Cardiovascular:     Rate and Rhythm: Normal rate and regular rhythm.  Pulmonary:     Effort: Pulmonary effort is normal.     Breath sounds: Normal breath sounds.  Musculoskeletal: Normal range of motion.     Lumbar back: She exhibits tenderness, pain and spasm. She exhibits no swelling, no edema and no deformity.     Left upper leg: Normal.     Left lower leg: Normal.     Left foot: Normal.  Lymphadenopathy:     Cervical: No cervical adenopathy.  Skin:    General: Skin is warm and dry.  Neurological:     General: No focal deficit present.     Mental Status: She is alert.      UC Treatments / Results  Labs (all labs ordered are listed, but only abnormal results are displayed) Labs Reviewed  POCT URINALYSIS DIP (MANUAL ENTRY)    EKG   Radiology No results found.  Procedures Procedures (including critical care time)  Medications Ordered in UC Medications  dexamethasone (DECADRON) injection 10 mg (10 mg  Intramuscular Given 09/11/19 1100)  ketorolac (TORADOL) injection 60 mg (60 mg Intramuscular Given 09/11/19 1100)    Initial Impression / Assessment and Plan / UC Course  I have reviewed the triage vital signs and the nursing notes.  Pertinent labs & imaging results that were available during my care of the patient were reviewed by me and considered in  my medical decision making (see chart for details).    53 y.o. female with a history of self limited episodes of low back pain in the past, previous osteoarthritis of lumbar spine and a previous herniated disc presents with a 1-week history of worsening lower back with radiation down the left lower leg. The patient has no "red flag" history indicative of complicated back pain. UA unremarkable. Patient was given decadron and Toradol in the clinic. Natural history and expected course discussed. Agricultural engineerducational material distributed. Proper lifting, bending technique discussed. Stretching exercises discussed. Short (2-4 day) period of relative rest recommended until acute symptoms improve. Heat to affected area as needed for local pain relief. Continue Mobic daily for at least 7 days. Continue Vicodin as needed. Continue muscle relaxants per medication orders. Follow-up with orthopedics.   Today's evaluation has revealed no signs of a dangerous process. Discussed diagnosis with patient and/or guardian. Patient and/or guardian aware of their diagnosis, possible red flag symptoms to watch out for and need for close follow up. Patient and/or guardian understands verbal and written discharge instructions. Patient and/or guardian comfortable with plan and disposition.  Patient and/or guardian has a clear mental status at this time, good insight into illness (after discussion and teaching) and has clear judgment to make decisions regarding their care  This care was provided during an unprecedented National Emergency due to the Novel Coronavirus (COVID-19) pandemic.  COVID-19 infections and transmission risks place heavy strains on healthcare resources.  As this pandemic evolves, our facility, providers, and staff strive to respond fluidly, to remain operational, and to provide care relative to available resources and information. Outcomes are unpredictable and treatments are without well-defined guidelines. Further, the impact of COVID-19 on all aspects of urgent care, including the impact to patients seeking care for reasons other than COVID-19, is unavoidable during this national emergency. At this time of the global pandemic, management of patients has significantly changed, even for non-COVID positive patients given high local and regional COVID volumes at this time requiring high healthcare system and resource utilization. The standard of care for management of both COVID suspected and non-COVID suspected patients continues to change rapidly at the local, regional, national, and global levels. This patient was worked up and treated to the best available but ever changing evidence and resources available at this current time.   Documentation was completed with the aid of voice recognition software. Transcription may contain typographical errors.  Final Clinical Impressions(s) / UC Diagnoses   Final diagnoses:  Lumbar pain     Discharge Instructions     1) Take your Mobic daily for the next 7 days. 2) Continue your muscle relaxants three times a day for the next 3 days then resume as needed  3) Take pain medicines as needed for SEVERE pain only 4) alternate between ice and heat to affected areas  5) no heavy lifting, bending, climbing or twisting for at least 5 days  6) follow-up with orthopedics     ED Prescriptions    Medication Sig Dispense Auth. Provider   oxyCODONE-acetaminophen (PERCOCET/ROXICET) 5-325 MG tablet Take 1 tablet by mouth every 4 (four) hours as needed for up to 5 days for severe pain. 15 tablet Lurline IdolMurrill, Colbin Jovel, FNP     I have  reviewed the PDMP during this encounter.   Lurline IdolMurrill, Alwaleed Obeso, [place] 09/11/19 1114

## 2019-09-11 NOTE — ED Notes (Signed)
Patient able to ambulate independently  

## 2019-09-11 NOTE — Discharge Instructions (Signed)
1) Take your Mobic daily for the next 7 days. 2) Continue your muscle relaxants three times a day for the next 3 days then resume as needed  3) Take pain medicines as needed for SEVERE pain only 4) alternate between ice and heat to affected areas  5) no heavy lifting, bending, climbing or twisting for at least 5 days  6) follow-up with orthopedics

## 2019-10-03 ENCOUNTER — Other Ambulatory Visit: Payer: Self-pay | Admitting: Orthopedic Surgery

## 2019-10-03 DIAGNOSIS — M259 Joint disorder, unspecified: Secondary | ICD-10-CM

## 2019-10-15 ENCOUNTER — Ambulatory Visit
Admission: RE | Admit: 2019-10-15 | Discharge: 2019-10-15 | Disposition: A | Discharge status: Home / Self Care | Payer: BC Managed Care – PPO | Source: Ambulatory Visit | Attending: Orthopedic Surgery | Admitting: Orthopedic Surgery

## 2019-10-15 ENCOUNTER — Other Ambulatory Visit: Payer: Self-pay

## 2019-10-15 DIAGNOSIS — M259 Joint disorder, unspecified: Secondary | ICD-10-CM

## 2019-10-22 ENCOUNTER — Other Ambulatory Visit: Payer: Self-pay

## 2019-10-22 ENCOUNTER — Ambulatory Visit: Payer: BLUE CROSS/BLUE SHIELD | Admitting: Podiatry

## 2019-10-22 ENCOUNTER — Ambulatory Visit (INDEPENDENT_AMBULATORY_CARE_PROVIDER_SITE_OTHER): Payer: BC Managed Care – PPO

## 2019-10-22 DIAGNOSIS — M7752 Other enthesopathy of left foot: Secondary | ICD-10-CM

## 2019-10-22 DIAGNOSIS — M2041 Other hammer toe(s) (acquired), right foot: Secondary | ICD-10-CM | POA: Diagnosis not present

## 2019-10-22 DIAGNOSIS — M2042 Other hammer toe(s) (acquired), left foot: Secondary | ICD-10-CM

## 2019-10-22 DIAGNOSIS — M7751 Other enthesopathy of right foot: Secondary | ICD-10-CM

## 2019-10-22 NOTE — Patient Instructions (Signed)
Pre-Operative Instructions  Congratulations, you have decided to take an important step towards improving your quality of life.  You can be assured that the doctors and staff at [facility] will be with you every step of the way.  Here are some important things you should know:  1. Plan to be at the surgery center/hospital at least 1 (one) hour prior to your scheduled time, unless otherwise directed by the surgical center/hospital staff.  You must have a responsible adult accompany you, remain during the surgery and drive you home.  Make sure you have directions to the surgical center/hospital to ensure you arrive on time. 2. If you are having surgery at [facility] or [facility], you will need a copy of your medical history and physical form from your family physician within one month prior to the date of surgery. We will give you a form for your primary physician to complete.  3. We make every effort to accommodate the date you request for surgery.  However, there are times where surgery dates or times have to be moved.  We will contact you as soon as possible if a change in schedule is required.   4. No aspirin/ibuprofen for one week before surgery.  If you are on aspirin, any non-steroidal anti-inflammatory medications (Mobic, Aleve, Ibuprofen) should not be taken seven (7) days prior to your surgery.  You make take Tylenol for pain prior to surgery.  5. Medications - If you are taking daily heart and blood pressure medications, seizure, reflux, allergy, asthma, anxiety, pain or diabetes medications, make sure you notify the surgery center/hospital before the day of surgery so they can tell you which medications you should take or avoid the day of surgery. 6. No food or drink after midnight the night before surgery unless directed otherwise by surgical center/hospital staff. 7. No alcoholic beverages 24-hours prior to surgery.  No smoking 24-hours prior or 24-hours after  surgery. 8. Wear loose pants or shorts. They should be loose enough to fit over bandages, boots, and casts. 9. Don't wear slip-on shoes. Sneakers are preferred. 10. Bring your boot with you to the surgery center/hospital.  Also bring crutches or a walker if your physician has prescribed it for you.  If you do not have this equipment, it will be provided for you after surgery. 11. If you have not been contacted by the surgery center/hospital by the day before your surgery, call to confirm the date and time of your surgery. 12. Leave-time from work may vary depending on the type of surgery you have.  Appropriate arrangements should be made prior to surgery with your employer. 13. Prescriptions will be provided immediately following surgery by your doctor.  Fill these as soon as possible after surgery and take the medication as directed. Pain medications will not be refilled on weekends and must be approved by the doctor. 14. Remove nail polish on the operative foot and avoid getting pedicures prior to surgery. 15. Wash the night before surgery.  The night before surgery wash the foot and leg well with water and the antibacterial soap provided. Be sure to pay special attention to beneath the toenails and in between the toes.  Wash for at least three (3) minutes. Rinse thoroughly with water and dry well with a towel.  Perform this wash unless told not to do so by your physician.  Enclosed: 1 Ice pack (please put in freezer the night before surgery)   1 Hibiclens skin cleaner     Pre-op instructions  If you have any questions regarding the instructions, please do not hesitate to call our office.  [place]: [address], [place], [place] [address] -- [phone]  [place]: [address] [place], [place] [address] -- [phone]  [place]: [address], [place], [place] [address] -- [phone]   Website: [email or web address] 

## 2019-10-29 NOTE — Progress Notes (Signed)
   HPI: 54 y.o. female presenting today for follow up evaluation of pain to the 5th toes bilaterally. She states the right fifth toe is worse than the left. She has gotten relief from injections in the past and would like more today. Touching the toes and wearing certain shoes increases the pain. Patient is here for further evaluation and treatment.   No past medical history on file.    Objective: Physical Exam General: The patient is alert and oriented x3 in no acute distress.  Dermatology: Skin is cool, dry and supple bilateral lower extremities. Negative for open lesions or macerations.  Vascular: Palpable pedal pulses bilaterally. No edema or erythema noted. Capillary refill within normal limits.  Neurological: Epicritic and protective threshold grossly intact bilaterally.   Musculoskeletal Exam: All pedal and ankle joints range of motion within normal limits bilateral. Muscle strength 5/5 in all groups bilateral. Hammertoe contracture deformity noted to the 5th digits of the bilateral feet. Pain with palpation noted to the 5th digits bilaterally.   Radiographic Exam: Hammertoe contracture deformity noted to the interphalangeal joints and MPJ of the respective hammertoe digits mentioned on clinical musculoskeletal exam.     Assessment: 1. 5th toe capsulitis bilateral 2. 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 Patient evaluated. X-Rays reviewed.  2. Injection of 0.5 mLs Celestone Soluspan injected into the 5th digits bilaterally.  3. Today we discussed the conservative versus surgical management of the presenting pathology. The patient opts for surgical management. All possible complications and details of the procedure were explained. All patient questions were answered. No guarantees were expressed or implied. 4. Authorization for surgery was initiated today. Surgery will consist of DIPJ arthroplasty 5th digit right; PIPJ arthroplasty 5th digit left.  5.  Return to clinic one week post op.    Works from home. Husband recently retired.     Felecia Shelling, DPM [facility]  Dr. Felecia Shelling, DPM    [address] [place], [place] [address]                Office [phone]  Fax [phone]

## 2019-11-07 ENCOUNTER — Other Ambulatory Visit: Payer: Self-pay | Admitting: Orthopedic Surgery

## 2019-11-07 DIAGNOSIS — M259 Joint disorder, unspecified: Secondary | ICD-10-CM

## 2019-11-17 ENCOUNTER — Other Ambulatory Visit: Payer: BC Managed Care – PPO

## 2019-11-21 ENCOUNTER — Ambulatory Visit
Admission: RE | Admit: 2019-11-21 | Discharge: 2019-11-21 | Disposition: A | Discharge status: Home / Self Care | Payer: BC Managed Care – PPO | Source: Ambulatory Visit | Attending: Orthopedic Surgery | Admitting: Orthopedic Surgery

## 2019-11-21 DIAGNOSIS — M259 Joint disorder, unspecified: Secondary | ICD-10-CM

## 2019-11-21 MED ORDER — METHYLPREDNISOLONE ACETATE 40 MG/ML INJ SUSP (RADIOLOG
120.0000 mg | Freq: Once | INTRAMUSCULAR | Status: AC
  Administered 2019-11-21: 120 mg via INTRA_ARTICULAR

## 2019-12-19 ENCOUNTER — Telehealth: Payer: Self-pay | Admitting: *Deleted

## 2019-12-19 NOTE — Telephone Encounter (Signed)
Patient is calling and has questions concerning upcoming surgery next week.Please call back.

## 2019-12-22 ENCOUNTER — Telehealth: Payer: Self-pay

## 2019-12-22 NOTE — Telephone Encounter (Signed)
DOS 12/25/2019  HAMMER TOE REPAIR 5TH B/L - [id number]  [employer] EFFECTIVE DATE - 01/24/2015  Copay - Not Applicable Coinsurance - 20%  Authorization Required - No

## 2019-12-25 ENCOUNTER — Telehealth: Payer: Self-pay | Admitting: Podiatry

## 2019-12-25 ENCOUNTER — Other Ambulatory Visit: Payer: Self-pay | Admitting: Podiatry

## 2019-12-25 DIAGNOSIS — M2041 Other hammer toe(s) (acquired), right foot: Secondary | ICD-10-CM | POA: Diagnosis not present

## 2019-12-25 DIAGNOSIS — M2042 Other hammer toe(s) (acquired), left foot: Secondary | ICD-10-CM | POA: Diagnosis not present

## 2019-12-25 MED ORDER — OXYCODONE-ACETAMINOPHEN 5-325 MG PO TABS: 1.0000 | ORAL_TABLET | Freq: Four times a day (QID) | ORAL | 0 refills | Status: DC | PRN

## 2019-12-25 NOTE — Progress Notes (Signed)
PRN postop 

## 2019-12-25 NOTE — Telephone Encounter (Signed)
Patient's husband called stating that she had surgery today with Dr. Logan Bores and has noticed bleeding to her right foot.  He thinks that the bleeding is stopped and started to dry.  Bleeding has gone to the Ace bandage.  It has not spread recently.  Seems to be localized.  Encouraged elevation of the operative foot.  I want him to marked the area and if the bleeding gets worse to let us know.  Otherwise I will have her come in the office tomorrow morning to see Dr. Allena Katz for dressing change.  He verbalizes understanding.  Encouraged to call back with any questions or concerns.

## 2019-12-26 ENCOUNTER — Ambulatory Visit (INDEPENDENT_AMBULATORY_CARE_PROVIDER_SITE_OTHER): Payer: BC Managed Care – PPO | Admitting: Podiatry

## 2019-12-26 ENCOUNTER — Ambulatory Visit (INDEPENDENT_AMBULATORY_CARE_PROVIDER_SITE_OTHER): Payer: BC Managed Care – PPO

## 2019-12-26 ENCOUNTER — Encounter: Payer: Self-pay | Admitting: Podiatry

## 2019-12-26 ENCOUNTER — Other Ambulatory Visit: Payer: Self-pay

## 2019-12-26 DIAGNOSIS — M7751 Other enthesopathy of right foot: Secondary | ICD-10-CM

## 2019-12-26 DIAGNOSIS — M7752 Other enthesopathy of left foot: Secondary | ICD-10-CM

## 2019-12-26 DIAGNOSIS — Z9889 Other specified postprocedural states: Secondary | ICD-10-CM

## 2019-12-26 DIAGNOSIS — M2042 Other hammer toe(s) (acquired), left foot: Secondary | ICD-10-CM

## 2019-12-26 DIAGNOSIS — M2041 Other hammer toe(s) (acquired), right foot: Secondary | ICD-10-CM

## 2019-12-26 NOTE — Progress Notes (Signed)
Subjective:  Patient ID: Debra Clayton, female    DOB: 24-Jun-1966,  MRN: [id number]  Chief Complaint  Patient presents with  . Routine Post Op     DOS 12/25/2019 HAMMER TOE REPAIR 5TH B/L/left foot bleeding per Dr Lacretia Nicks    54 y.o. female returns for post-op check.  Patient presents with complaint of postop bleeding after the surgery.  Patient was seen by me today in clinic patient does not want an x-ray.  Patient states that there is no clinical signs of infection she was just concerned with excessive bleeding to of the fifth toe.  Today the bandages are clean dry and intact.  There was some dried blood associated with it but there is no active bleeding noted.  Review of Systems: Negative except as noted in the HPI. Denies N/V/F/Ch.  No past medical history on file.  Current Outpatient Medications:  .  Ascorbic Acid (VITAMIN C PO), Take by mouth., Disp: , Rfl:  .  BIOTIN PO, Take by mouth., Disp: , Rfl:  .  BISACODYL 5 MG EC tablet, Take 10 mg by mouth 2 (two) times daily., Disp: , Rfl:  .  calcium carbonate (OS-CAL) 600 MG TABS tablet, Take by mouth., Disp: , Rfl:  .  Dapsone (ACZONE) 7.5 % GEL, Aczone 7.5 % topical gel with pump, Disp: , Rfl:  .  doxycycline (VIBRA-TABS) 100 MG tablet, doxycycline hyclate 100 mg tablet, Disp: , Rfl:  .  erythromycin (ERY-TAB) 333 MG EC tablet, Ery-Tab 333 mg tablet,delayed release, Disp: , Rfl:  .  gabapentin (NEURONTIN) 300 MG capsule, Take by mouth., Disp: , Rfl:  .  hydroquinone 4 % cream, hydroquinone 4 % topical cream, Disp: , Rfl:  .  meloxicam (MOBIC) 15 MG tablet, Take 15 mg by mouth daily., Disp: , Rfl: 3 .  methocarbamol (ROBAXIN) 500 MG tablet, Take 500 mg by mouth 3 (three) times daily., Disp: , Rfl: 0 .  MULTIPLE VITAMIN PO, Take by mouth., Disp: , Rfl:  .  oxyCODONE-acetaminophen (PERCOCET) 5-325 MG tablet, Take 1 tablet by mouth every 6 (six) hours as needed for severe pain., Disp: 30 tablet, Rfl: 0 .  PROAIR HFA 108 (90 Base) MCG/ACT  inhaler, USE 2 PUFFS INHALED EVERY 4-6 HOURS AS NEEDED, Disp: , Rfl: 0 .  temazepam (RESTORIL) 15 MG capsule, Take 15 mg by mouth at bedtime as needed., Disp: , Rfl:  .  tretinoin (RETIN-A) 0.05 % cream, tretinoin 0.05 % topical cream, Disp: , Rfl:   Social History   Tobacco Use  Smoking Status Former Smoker  Smokeless Tobacco Never Used    Allergies  Allergen Reactions  . Penicillins Other (See Comments)   Objective:  There were no vitals filed for this visit. There is no height or weight on file to calculate BMI. Constitutional Well developed. Well nourished.  Vascular Foot warm and well perfused. Capillary refill normal to all digits.   Neurologic Normal speech. Oriented to person, place, and time. Epicritic sensation to light touch grossly present bilaterally.  Dermatologic Skin healing well without signs of infection. Skin edges well coapted without signs of infection.  No active bleeding  Orthopedic: Tenderness to palpation noted about the surgical site.   Radiographs: None Assessment:   1. Hammer toe of left foot   2. Capsulitis of toe, right   3. Hammertoe of right foot   4. Capsulitis of toe, left   5. Status post foot surgery    Plan:  Patient was evaluated and treated and  all questions answered.  S/p foot surgery left -Progressing as expected post-operatively. -XR: None -WB Status: Weightbearing as tolerated in surgical shoe bilaterally -Sutures: Intact without any signs of dehiscence.  No clinical signs of infection noted.  No active bleeding noted. -Medications: None -Foot redressed.  No follow-ups on file.

## 2019-12-31 ENCOUNTER — Encounter: Payer: BC Managed Care – PPO | Admitting: Podiatry

## 2020-01-05 ENCOUNTER — Ambulatory Visit (INDEPENDENT_AMBULATORY_CARE_PROVIDER_SITE_OTHER): Payer: BC Managed Care – PPO

## 2020-01-05 ENCOUNTER — Other Ambulatory Visit: Payer: Self-pay

## 2020-01-05 ENCOUNTER — Ambulatory Visit (INDEPENDENT_AMBULATORY_CARE_PROVIDER_SITE_OTHER): Payer: BC Managed Care – PPO | Admitting: Podiatry

## 2020-01-05 DIAGNOSIS — M2041 Other hammer toe(s) (acquired), right foot: Secondary | ICD-10-CM | POA: Diagnosis not present

## 2020-01-05 DIAGNOSIS — Z9889 Other specified postprocedural states: Secondary | ICD-10-CM

## 2020-01-05 DIAGNOSIS — M2042 Other hammer toe(s) (acquired), left foot: Secondary | ICD-10-CM | POA: Diagnosis not present

## 2020-01-05 MED ORDER — OXYCODONE-ACETAMINOPHEN 5-325 MG PO TABS: 1.0000 | ORAL_TABLET | Freq: Four times a day (QID) | ORAL | 0 refills | Status: AC | PRN

## 2020-01-07 ENCOUNTER — Encounter: Payer: BC Managed Care – PPO | Admitting: Podiatry

## 2020-01-07 NOTE — Progress Notes (Signed)
   Subjective:  Patient presents today status post bilateral 5th toe arthroplasties. DOS: 12/25/2019. She reports some intermittent sharp pain. She reports some mild associated swelling. She states she was taking Percocet for pain but stopped a couple of days ago. She continues to use the post op shoes as directed. She denies any modifying factors at this time. Patient is here for further evaluation and treatment.    No past medical history on file.    Objective/Physical Exam Neurovascular status intact.  Skin incisions appear to be well coapted with sutures and staples intact. No sign of infectious process noted. No dehiscence. No active bleeding noted. Moderate edema noted to the surgical extremity.  Radiographic Exam:  Osteotomies sites appear to be stable with routine healing.  Assessment: 1. s/p bilateral fifth toe arthroplasties. DOS: 12/25/2019   Plan of Care:  1. Patient was evaluated. X-rays reviewed 2. Dressing changed.  3. Continue weightbearing in post op shoes.  4. Refill prescription for Percocet 5/325 mg provided to patient.  5. Return to clinic in one week for suture removal.     Felecia Shelling, DPM [facility]  Dr. Felecia Shelling, DPM    [address]                                        [place], [place] [address]                Office [phone]  Fax [phone]

## 2020-01-12 ENCOUNTER — Ambulatory Visit (INDEPENDENT_AMBULATORY_CARE_PROVIDER_SITE_OTHER): Payer: BC Managed Care – PPO | Admitting: Podiatry

## 2020-01-12 ENCOUNTER — Other Ambulatory Visit: Payer: Self-pay

## 2020-01-12 DIAGNOSIS — M2042 Other hammer toe(s) (acquired), left foot: Secondary | ICD-10-CM

## 2020-01-12 DIAGNOSIS — Z9889 Other specified postprocedural states: Secondary | ICD-10-CM

## 2020-01-12 DIAGNOSIS — M2041 Other hammer toe(s) (acquired), right foot: Secondary | ICD-10-CM

## 2020-01-14 NOTE — Progress Notes (Signed)
   Subjective:  Patient presents today status post bilateral 5th toe arthroplasties. DOS: 12/25/2019. She states she is doing well and improving. She denies any significant pain or modifying factors. She has been using the post op shoes as directed. Patient is here for further evaluation and treatment.    No past medical history on file.    Objective/Physical Exam Neurovascular status intact.  Skin incisions appear to be well coapted with sutures and staples intact. No sign of infectious process noted. No dehiscence. No active bleeding noted. Moderate edema noted to the surgical extremity.  Assessment: 1. s/p bilateral fifth toe arthroplasties. DOS: 12/25/2019   Plan of Care:  1. Patient was evaluated.  2. Sutures removed.  3. Recommended good sneakers for four weeks.  4. Discontinue using post op shoes.  5. Return to clinic in 4 weeks for final follow up X-Ray.    Felecia Shelling, DPM [facility]  Dr. Felecia Shelling, DPM    [address]                                        [place], [place] [address]                Office [phone]  Fax [phone]

## 2020-01-21 ENCOUNTER — Encounter: Payer: BC Managed Care – PPO | Admitting: Podiatry

## 2020-01-26 ENCOUNTER — Encounter: Payer: BC Managed Care – PPO | Admitting: Podiatry

## 2020-02-09 ENCOUNTER — Encounter: Payer: BC Managed Care – PPO | Admitting: Podiatry

## 2020-02-16 ENCOUNTER — Other Ambulatory Visit: Payer: Self-pay

## 2020-02-16 ENCOUNTER — Encounter: Payer: Self-pay | Admitting: Podiatry

## 2020-02-16 ENCOUNTER — Other Ambulatory Visit: Payer: Self-pay | Admitting: Podiatry

## 2020-02-16 ENCOUNTER — Ambulatory Visit (INDEPENDENT_AMBULATORY_CARE_PROVIDER_SITE_OTHER): Payer: BC Managed Care – PPO | Admitting: Podiatry

## 2020-02-16 ENCOUNTER — Ambulatory Visit (INDEPENDENT_AMBULATORY_CARE_PROVIDER_SITE_OTHER): Payer: BC Managed Care – PPO

## 2020-02-16 VITALS — Temp 97.4°F

## 2020-02-16 DIAGNOSIS — M2041 Other hammer toe(s) (acquired), right foot: Secondary | ICD-10-CM

## 2020-02-16 DIAGNOSIS — M79671 Pain in right foot: Secondary | ICD-10-CM

## 2020-02-16 DIAGNOSIS — M2042 Other hammer toe(s) (acquired), left foot: Secondary | ICD-10-CM

## 2020-02-16 DIAGNOSIS — Z9889 Other specified postprocedural states: Secondary | ICD-10-CM

## 2020-02-16 DIAGNOSIS — M79672 Pain in left foot: Secondary | ICD-10-CM

## 2020-02-19 NOTE — Progress Notes (Signed)
   Subjective:  Patient presents today status post bilateral 5th toe arthroplasties. DOS: 12/25/2019. She reports some intermittent pain of the left foot. She states the right foot is doing well. She has been icing the feet for treatment. There are no worsening factors noted. Patient is here for further evaluation and treatment.    History reviewed. No pertinent past medical history.    Objective/Physical Exam Neurovascular status intact.  Skin incisions appear to be well coapted. No sign of infectious process noted. No dehiscence. No active bleeding noted. Moderate edema noted to the surgical extremity.  Radiographic Exam:  Orthopedic hardware and osteotomies sites appear to be stable with routine healing.  Assessment: 1. s/p bilateral fifth toe arthroplasties. DOS: 12/25/2019   Plan of Care:  1. Patient was evaluated. X-rays reviewed.  2. Recommended good shoe gear.  3. May resume full activity with no restrictions.  4. Return to clinic as needed.   Going to the [employer].     Felecia Shelling, DPM [facility]  Dr. Felecia Shelling, DPM    [address]                                        [place], [place] [address]                Office [phone]  Fax [phone]

## 2020-02-23 ENCOUNTER — Telehealth: Payer: Self-pay | Admitting: Podiatry

## 2020-02-23 NOTE — Telephone Encounter (Addendum)
Patient called Thursday evening stating that she noticed some dried blood on the bandage.  She had surgery this morning with Dr. Logan Bores. It is a small amount. Encouraged her to elevate foot and monitor. If there is any spreading or worsening of the bleeding to let me know.

## 2020-12-06 IMAGING — CT CT BIOPSY
1 of 4 series · 12 of 32 positions shown, 18 images · non-contrast
Comparison: none

CLINICAL DATA: Positive response to previous anesthetic injection.
Clinical request for left steroid injection.

[Series 2: needle -guided injection · axial · 0.73mm/px · z∈[-121,-65]mm · 12 of 34 slices shown, 18 images]
[im 3/34  soft-tissue]
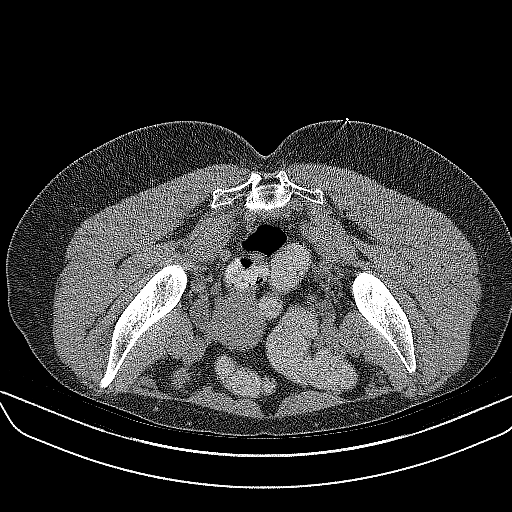
[im 3/34  bone]
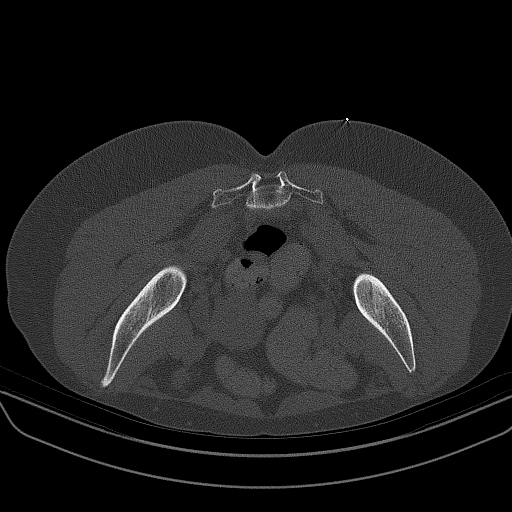
[im 6/34  soft-tissue]
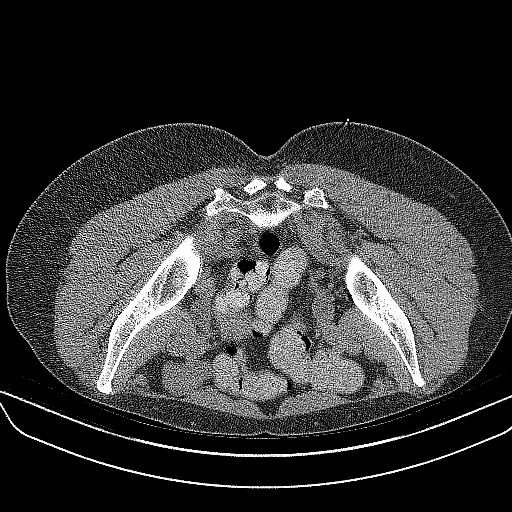
[im 8/34  soft-tissue]
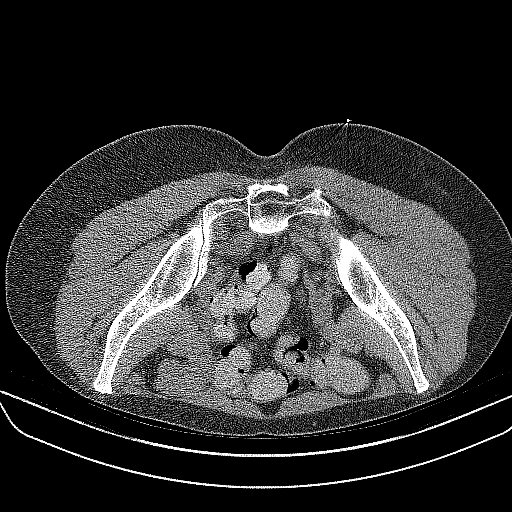
[im 11/34  soft-tissue]
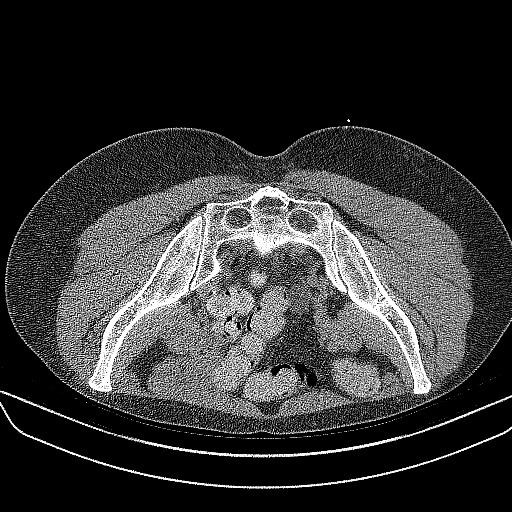
[im 13/34  soft-tissue]
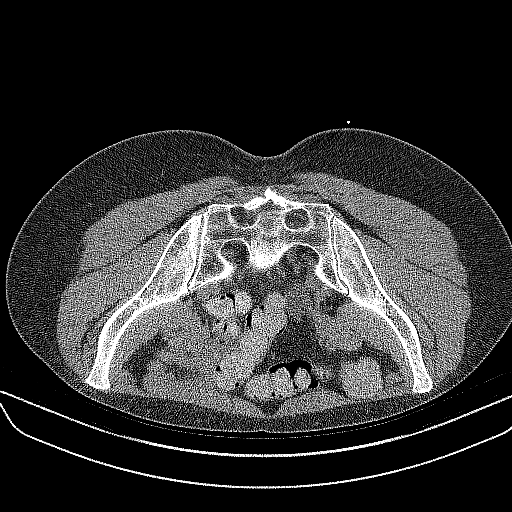
[im 16/34  soft-tissue]
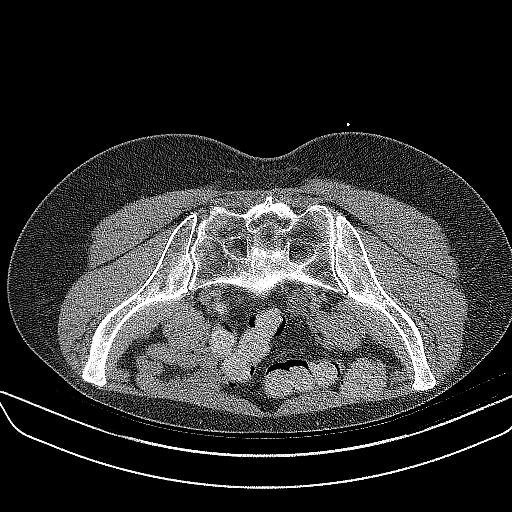
[im 18/34  soft-tissue]
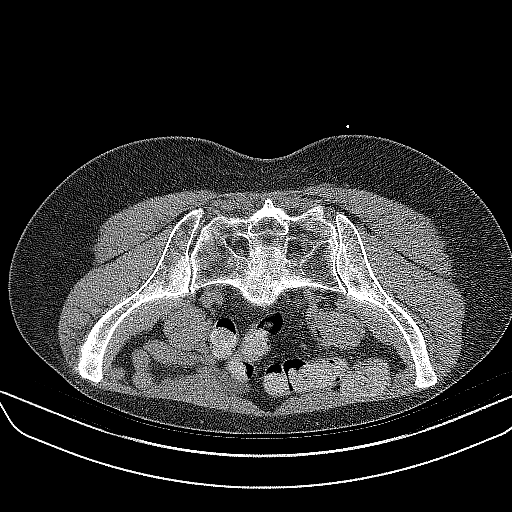
[im 21/34  soft-tissue]
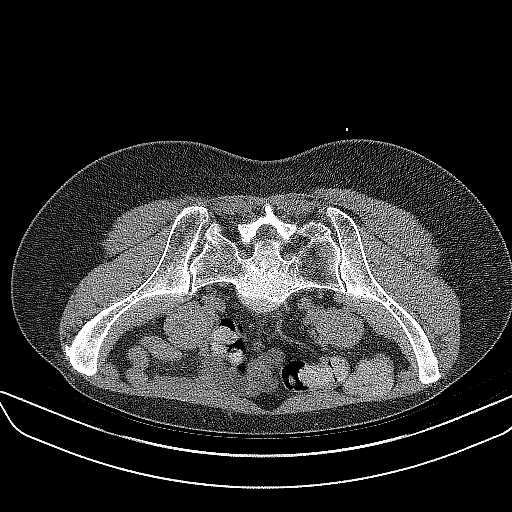
[im 23/34  soft-tissue]
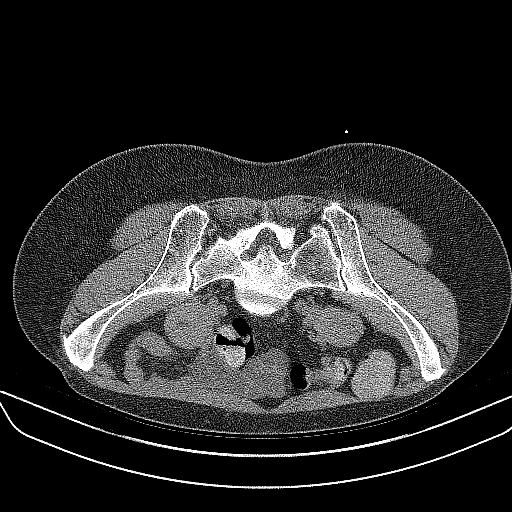
[im 23/34  lung]
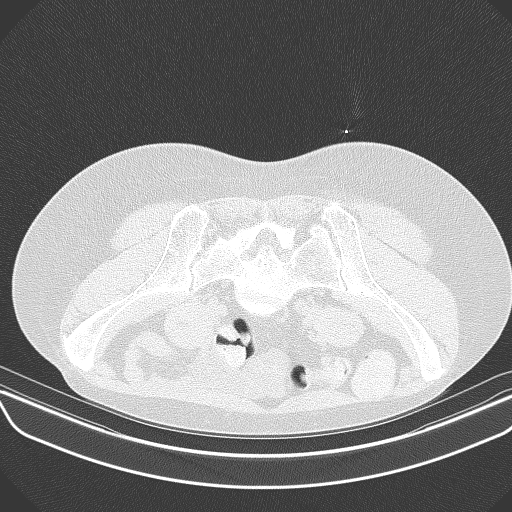
[im 23/34  bone]
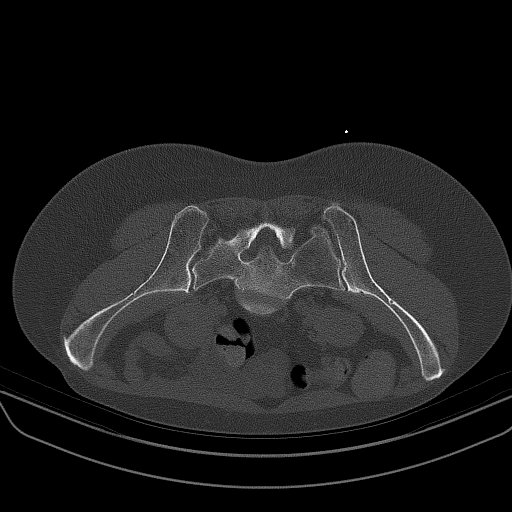
[im 26/34  soft-tissue]
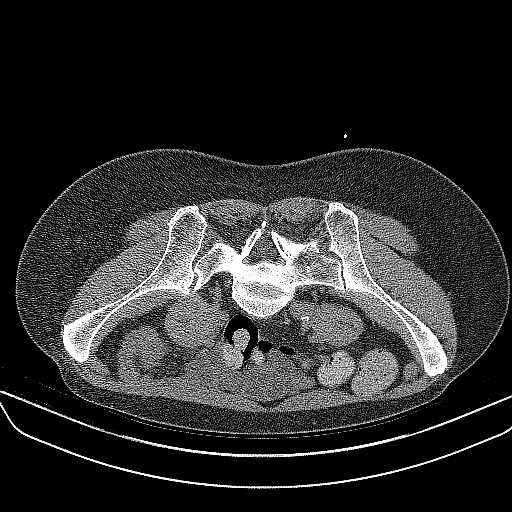
[im 26/34  lung]
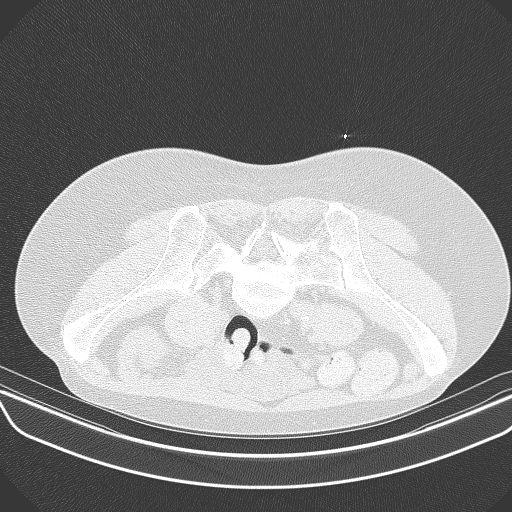
[im 28/34  soft-tissue]
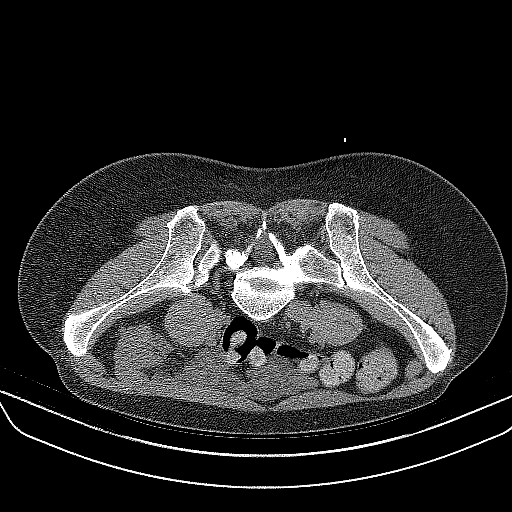
[im 28/34  lung]
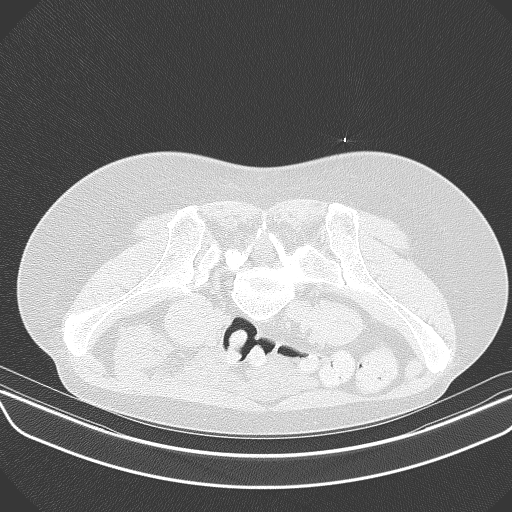
[im 31/34  soft-tissue]
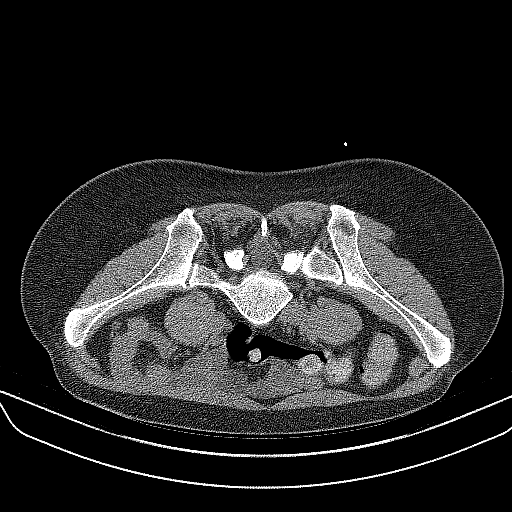
[im 31/34  lung]
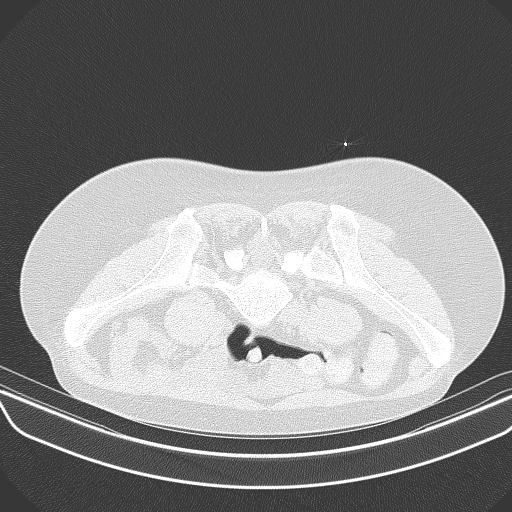

[12 of 32 positions shown; findings below may reference images not displayed]

EXAM:
Left CT GUIDED SI JOINT INJECTION



After local anesthesia with 1% lidocaine without epinephrine and
subsequent deep anesthesia, a 22 gauge spinal needle was advanced
into the left SI joint under intermittent CT guidance.

Once the needle was in satisfactory position, representative image
was captured with the needle demonstrated in the sacroiliac joint.
Subsequently, 120 mg Depo-Medrol mixed with a few drops of 1%
lidocaine was injected into the left SI joint. Needles removed and a
sterile dressing applied.

No complications were observed.
IMPRESSION: Successful CT-guided left SI joint injection utilizing Depo-Medrol
and a small amount of lidocaine.

## 2023-09-12 ENCOUNTER — Ambulatory Visit (INDEPENDENT_AMBULATORY_CARE_PROVIDER_SITE_OTHER): Payer: BC Managed Care – PPO | Admitting: Podiatry

## 2023-09-12 ENCOUNTER — Ambulatory Visit (INDEPENDENT_AMBULATORY_CARE_PROVIDER_SITE_OTHER): Payer: BC Managed Care – PPO

## 2023-09-12 DIAGNOSIS — R200 Anesthesia of skin: Secondary | ICD-10-CM

## 2023-09-12 DIAGNOSIS — M79672 Pain in left foot: Secondary | ICD-10-CM

## 2023-09-12 NOTE — Progress Notes (Signed)
   Chief Complaint  Patient presents with   Foot Pain    RM6: NEW PT-left foot last 3 toes are numb and tingling and pain in that foot, right foot the same    HPI: 57 y.o. female presenting today for evaluation of irritation to the left forefoot.  Patient states that over the last 8 months she developed some irritation to the left forefoot when walking barefoot or exercising on a trampoline barefoot.  When she finishes exercising or put shoes on she immediately starts to feel better.  This has been intermittent over the last 8 months but last week she had an exacerbation of pain.  She has since discontinued her trampoline exercises and the pain is mostly resolved.  No past medical history on file.  No past surgical history on file.  Allergies  Allergen Reactions   Penicillins Other (See Comments)     Physical Exam: General: The patient is alert and oriented x3 in no acute distress.  Dermatology: Skin is warm, dry and supple bilateral lower extremities.   Vascular: Palpable pedal pulses bilaterally. Capillary refill within normal limits.  No appreciable edema.  No erythema.  Neurological: Grossly intact via light touch  Musculoskeletal Exam: No pedal deformities noted  Radiographic Exam B/L feet 09/12/2023:  Normal osseous mineralization. Joint spaces preserved.  No fractures or osseous irregularities noted.  Impression: Negative  Assessment/Plan of Care: 1.  Neuritis left foot secondary to barefoot and 'rebounding' exercises while barefoot  -Patient evaluated -Today we discussed pathology and etiology of irritated nerves.  Patient only has a sensation when she is either walking around the house barefoot or 'rebounding/trampoline jumping' for exercise at home.  Recommend that she modifies his activities and does not go barefoot around the house.  Also recommend good supportive shoes when exercising -Patient did inquire about gabapentin or cortisone injection but currently she has  no pain or tenderness. -Return to clinic as needed       Felecia Shelling, DPM [facility]  Dr. Felecia Shelling, DPM    [address] [place], [place] [address]                Office [phone]  Fax [phone]
